# Patient Record
Sex: Female | Born: 1985
Health system: Southern US, Community
[De-identification: ages and names within clinical notes are randomized; demographics above are authoritative.]

## PROBLEM LIST (undated history)

## (undated) DIAGNOSIS — G510 Bell's palsy: Secondary | ICD-10-CM

## (undated) HISTORY — DX: Bell's palsy: G51.0

---

## 2021-08-29 DIAGNOSIS — R918 Other nonspecific abnormal finding of lung field: Secondary | ICD-10-CM | POA: Diagnosis not present

## 2021-08-29 DIAGNOSIS — R000 Tachycardia, unspecified: Secondary | ICD-10-CM | POA: Diagnosis not present

## 2021-08-29 DIAGNOSIS — S270XXA Traumatic pneumothorax, initial encounter: Secondary | ICD-10-CM | POA: Diagnosis not present

## 2021-08-29 DIAGNOSIS — S32058A Other fracture of fifth lumbar vertebra, initial encounter for closed fracture: Secondary | ICD-10-CM | POA: Diagnosis not present

## 2021-08-29 DIAGNOSIS — J90 Pleural effusion, not elsewhere classified: Secondary | ICD-10-CM | POA: Diagnosis not present

## 2021-08-29 DIAGNOSIS — S299XXA Unspecified injury of thorax, initial encounter: Secondary | ICD-10-CM | POA: Diagnosis not present

## 2021-08-29 DIAGNOSIS — S2091XA Abrasion of unspecified parts of thorax, initial encounter: Secondary | ICD-10-CM | POA: Diagnosis not present

## 2021-08-29 DIAGNOSIS — S30811A Abrasion of abdominal wall, initial encounter: Secondary | ICD-10-CM | POA: Diagnosis not present

## 2021-08-29 DIAGNOSIS — J939 Pneumothorax, unspecified: Secondary | ICD-10-CM | POA: Diagnosis not present

## 2021-08-29 DIAGNOSIS — S27321A Contusion of lung, unilateral, initial encounter: Secondary | ICD-10-CM | POA: Diagnosis not present

## 2021-08-29 DIAGNOSIS — S2242XA Multiple fractures of ribs, left side, initial encounter for closed fracture: Secondary | ICD-10-CM | POA: Diagnosis not present

## 2021-08-29 DIAGNOSIS — S36031A Moderate laceration of spleen, initial encounter: Secondary | ICD-10-CM | POA: Diagnosis not present

## 2021-08-29 DIAGNOSIS — Z043 Encounter for examination and observation following other accident: Secondary | ICD-10-CM | POA: Diagnosis not present

## 2021-08-30 DIAGNOSIS — Z4682 Encounter for fitting and adjustment of non-vascular catheter: Secondary | ICD-10-CM | POA: Diagnosis not present

## 2021-08-30 DIAGNOSIS — J939 Pneumothorax, unspecified: Secondary | ICD-10-CM | POA: Diagnosis not present

## 2021-08-30 DIAGNOSIS — Z452 Encounter for adjustment and management of vascular access device: Secondary | ICD-10-CM | POA: Diagnosis not present

## 2021-09-05 DIAGNOSIS — J9610 Chronic respiratory failure, unspecified whether with hypoxia or hypercapnia: Secondary | ICD-10-CM | POA: Diagnosis not present

## 2021-09-05 DIAGNOSIS — S199XXA Unspecified injury of neck, initial encounter: Secondary | ICD-10-CM | POA: Diagnosis not present

## 2021-09-06 DIAGNOSIS — R609 Edema, unspecified: Secondary | ICD-10-CM | POA: Diagnosis not present

## 2021-09-06 DIAGNOSIS — S36031A Moderate laceration of spleen, initial encounter: Secondary | ICD-10-CM | POA: Diagnosis not present

## 2021-09-06 DIAGNOSIS — S15092A Other specified injury of left carotid artery, initial encounter: Secondary | ICD-10-CM | POA: Diagnosis not present

## 2021-09-06 DIAGNOSIS — Z9911 Dependence on respirator [ventilator] status: Secondary | ICD-10-CM | POA: Diagnosis not present

## 2021-09-08 DIAGNOSIS — S15092A Other specified injury of left carotid artery, initial encounter: Secondary | ICD-10-CM | POA: Diagnosis not present

## 2021-09-08 DIAGNOSIS — S36031A Moderate laceration of spleen, initial encounter: Secondary | ICD-10-CM | POA: Diagnosis not present

## 2021-09-08 DIAGNOSIS — Z930 Tracheostomy status: Secondary | ICD-10-CM | POA: Diagnosis not present

## 2021-09-09 DIAGNOSIS — S36031A Moderate laceration of spleen, initial encounter: Secondary | ICD-10-CM | POA: Diagnosis not present

## 2021-09-09 DIAGNOSIS — Z930 Tracheostomy status: Secondary | ICD-10-CM | POA: Diagnosis not present

## 2021-09-09 DIAGNOSIS — S15092A Other specified injury of left carotid artery, initial encounter: Secondary | ICD-10-CM | POA: Diagnosis not present

## 2021-09-16 DIAGNOSIS — S14105D Unspecified injury at C5 level of cervical spinal cord, subsequent encounter: Secondary | ICD-10-CM | POA: Diagnosis not present

## 2021-09-16 DIAGNOSIS — I639 Cerebral infarction, unspecified: Secondary | ICD-10-CM | POA: Diagnosis not present

## 2021-09-16 DIAGNOSIS — G950 Syringomyelia and syringobulbia: Secondary | ICD-10-CM | POA: Diagnosis not present

## 2021-09-16 DIAGNOSIS — S14106D Unspecified injury at C6 level of cervical spinal cord, subsequent encounter: Secondary | ICD-10-CM | POA: Diagnosis not present

## 2021-09-16 DIAGNOSIS — S02113D Unspecified occipital condyle fracture, subsequent encounter for fracture with routine healing: Secondary | ICD-10-CM | POA: Diagnosis not present

## 2021-09-19 DIAGNOSIS — I7774 Dissection of vertebral artery: Secondary | ICD-10-CM | POA: Diagnosis not present

## 2021-09-19 DIAGNOSIS — R930 Abnormal findings on diagnostic imaging of skull and head, not elsewhere classified: Secondary | ICD-10-CM | POA: Diagnosis not present

## 2021-09-22 DIAGNOSIS — E039 Hypothyroidism, unspecified: Secondary | ICD-10-CM | POA: Diagnosis not present

## 2021-09-22 DIAGNOSIS — S069X0A Unspecified intracranial injury without loss of consciousness, initial encounter: Secondary | ICD-10-CM | POA: Diagnosis not present

## 2021-09-22 DIAGNOSIS — R000 Tachycardia, unspecified: Secondary | ICD-10-CM | POA: Diagnosis not present

## 2021-09-22 DIAGNOSIS — R739 Hyperglycemia, unspecified: Secondary | ICD-10-CM | POA: Diagnosis not present

## 2021-09-24 DIAGNOSIS — R918 Other nonspecific abnormal finding of lung field: Secondary | ICD-10-CM | POA: Diagnosis not present

## 2021-09-24 DIAGNOSIS — J90 Pleural effusion, not elsewhere classified: Secondary | ICD-10-CM | POA: Diagnosis not present

## 2021-09-24 DIAGNOSIS — R509 Fever, unspecified: Secondary | ICD-10-CM | POA: Diagnosis not present

## 2021-09-26 DIAGNOSIS — B9620 Unspecified Escherichia coli [E. coli] as the cause of diseases classified elsewhere: Secondary | ICD-10-CM | POA: Diagnosis not present

## 2021-09-26 DIAGNOSIS — R7881 Bacteremia: Secondary | ICD-10-CM | POA: Diagnosis not present

## 2021-09-27 DIAGNOSIS — R7881 Bacteremia: Secondary | ICD-10-CM | POA: Diagnosis not present

## 2021-09-27 DIAGNOSIS — B9620 Unspecified Escherichia coli [E. coli] as the cause of diseases classified elsewhere: Secondary | ICD-10-CM | POA: Diagnosis not present

## 2021-09-28 DIAGNOSIS — I959 Hypotension, unspecified: Secondary | ICD-10-CM | POA: Diagnosis not present

## 2021-09-28 DIAGNOSIS — E039 Hypothyroidism, unspecified: Secondary | ICD-10-CM | POA: Diagnosis not present

## 2021-09-28 DIAGNOSIS — S069X0A Unspecified intracranial injury without loss of consciousness, initial encounter: Secondary | ICD-10-CM | POA: Diagnosis not present

## 2021-09-28 DIAGNOSIS — R739 Hyperglycemia, unspecified: Secondary | ICD-10-CM | POA: Diagnosis not present

## 2021-09-30 DIAGNOSIS — E039 Hypothyroidism, unspecified: Secondary | ICD-10-CM | POA: Diagnosis not present

## 2021-09-30 DIAGNOSIS — S069X0A Unspecified intracranial injury without loss of consciousness, initial encounter: Secondary | ICD-10-CM | POA: Diagnosis not present

## 2021-09-30 DIAGNOSIS — H532 Diplopia: Secondary | ICD-10-CM | POA: Diagnosis not present

## 2021-09-30 DIAGNOSIS — R000 Tachycardia, unspecified: Secondary | ICD-10-CM | POA: Diagnosis not present

## 2021-10-01 DIAGNOSIS — I959 Hypotension, unspecified: Secondary | ICD-10-CM | POA: Diagnosis not present

## 2021-10-01 DIAGNOSIS — H532 Diplopia: Secondary | ICD-10-CM | POA: Diagnosis not present

## 2021-10-01 DIAGNOSIS — E039 Hypothyroidism, unspecified: Secondary | ICD-10-CM | POA: Diagnosis not present

## 2021-10-01 DIAGNOSIS — S069X0A Unspecified intracranial injury without loss of consciousness, initial encounter: Secondary | ICD-10-CM | POA: Diagnosis not present

## 2021-10-02 DIAGNOSIS — E039 Hypothyroidism, unspecified: Secondary | ICD-10-CM | POA: Diagnosis not present

## 2021-10-02 DIAGNOSIS — S069X0A Unspecified intracranial injury without loss of consciousness, initial encounter: Secondary | ICD-10-CM | POA: Diagnosis not present

## 2021-10-02 DIAGNOSIS — I959 Hypotension, unspecified: Secondary | ICD-10-CM | POA: Diagnosis not present

## 2021-10-02 DIAGNOSIS — R000 Tachycardia, unspecified: Secondary | ICD-10-CM | POA: Diagnosis not present

## 2021-10-03 DIAGNOSIS — R000 Tachycardia, unspecified: Secondary | ICD-10-CM | POA: Diagnosis not present

## 2021-10-03 DIAGNOSIS — S069X0A Unspecified intracranial injury without loss of consciousness, initial encounter: Secondary | ICD-10-CM | POA: Diagnosis not present

## 2021-10-03 DIAGNOSIS — I959 Hypotension, unspecified: Secondary | ICD-10-CM | POA: Diagnosis not present

## 2021-10-03 DIAGNOSIS — E039 Hypothyroidism, unspecified: Secondary | ICD-10-CM | POA: Diagnosis not present

## 2021-10-20 ENCOUNTER — Encounter: Payer: Self-pay | Admitting: Occupational Therapy

## 2021-10-20 ENCOUNTER — Ambulatory Visit: Payer: 59 | Attending: Family Medicine | Admitting: Speech Pathology

## 2021-10-20 ENCOUNTER — Ambulatory Visit: Payer: 59 | Admitting: Occupational Therapy

## 2021-10-20 DIAGNOSIS — R41841 Cognitive communication deficit: Secondary | ICD-10-CM | POA: Diagnosis not present

## 2021-10-20 DIAGNOSIS — M6281 Muscle weakness (generalized): Secondary | ICD-10-CM
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
## 2021-10-26 DIAGNOSIS — H5022 Vertical strabismus, left eye: Secondary | ICD-10-CM | POA: Diagnosis not present

## 2021-10-27 ENCOUNTER — Encounter: Payer: Self-pay | Admitting: Speech Pathology

## 2021-10-27 ENCOUNTER — Ambulatory Visit: Payer: 59 | Attending: Family Medicine | Admitting: Occupational Therapy

## 2021-10-27 DIAGNOSIS — R2681 Unsteadiness on feet: Secondary | ICD-10-CM | POA: Diagnosis not present

## 2021-10-27 DIAGNOSIS — R278 Other lack of coordination: Secondary | ICD-10-CM | POA: Diagnosis not present

## 2021-10-27 DIAGNOSIS — R41841 Cognitive communication deficit: Secondary | ICD-10-CM | POA: Insufficient documentation

## 2021-10-27 DIAGNOSIS — M6281 Muscle weakness (generalized): Secondary | ICD-10-CM | POA: Diagnosis not present
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
## 2021-10-31 ENCOUNTER — Encounter: Payer: Self-pay | Admitting: Speech Pathology

## 2021-10-31 ENCOUNTER — Ambulatory Visit: Payer: 59 | Admitting: Occupational Therapy

## 2021-10-31 DIAGNOSIS — R2681 Unsteadiness on feet: Secondary | ICD-10-CM | POA: Diagnosis not present

## 2021-10-31 DIAGNOSIS — M6281 Muscle weakness (generalized): Secondary | ICD-10-CM

## 2021-10-31 DIAGNOSIS — R41841 Cognitive communication deficit: Secondary | ICD-10-CM | POA: Diagnosis not present

## 2021-10-31 DIAGNOSIS — R278 Other lack of coordination: Secondary | ICD-10-CM | POA: Diagnosis not present
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
## 2021-11-03 ENCOUNTER — Encounter: Payer: Self-pay | Admitting: Speech Pathology

## 2021-11-03 ENCOUNTER — Ambulatory Visit: Payer: 59 | Admitting: Occupational Therapy

## 2021-11-03 DIAGNOSIS — M6281 Muscle weakness (generalized): Secondary | ICD-10-CM

## 2021-11-03 DIAGNOSIS — R2681 Unsteadiness on feet: Secondary | ICD-10-CM | POA: Diagnosis not present

## 2021-11-03 DIAGNOSIS — S0211BD Type I occipital condyle fracture, left side, subsequent encounter for fracture with routine healing: Secondary | ICD-10-CM | POA: Diagnosis not present

## 2021-11-03 DIAGNOSIS — S0211BA Type I occipital condyle fracture, left side, initial encounter for closed fracture: Secondary | ICD-10-CM | POA: Diagnosis not present

## 2021-11-03 DIAGNOSIS — R41841 Cognitive communication deficit: Secondary | ICD-10-CM | POA: Diagnosis not present

## 2021-11-03 DIAGNOSIS — H4912 Fourth [trochlear] nerve palsy, left eye: Secondary | ICD-10-CM | POA: Diagnosis not present

## 2021-11-03 DIAGNOSIS — R278 Other lack of coordination: Secondary | ICD-10-CM

## 2021-11-03 DIAGNOSIS — I7771 Dissection of carotid artery: Secondary | ICD-10-CM | POA: Diagnosis not present

## 2021-11-03 DIAGNOSIS — I7774 Dissection of vertebral artery: Secondary | ICD-10-CM | POA: Diagnosis not present

## 2021-11-03 DIAGNOSIS — M4602 Spinal enthesopathy, cervical region: Secondary | ICD-10-CM | POA: Diagnosis not present
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
## 2021-11-07 ENCOUNTER — Other Ambulatory Visit: Payer: Self-pay | Admitting: Neurosurgery

## 2021-11-07 DIAGNOSIS — S0211BA Type I occipital condyle fracture, left side, initial encounter for closed fracture: Secondary | ICD-10-CM

## 2021-11-08 ENCOUNTER — Ambulatory Visit: Payer: 59 | Admitting: Occupational Therapy

## 2021-11-08 ENCOUNTER — Encounter: Payer: Self-pay | Admitting: Speech Pathology

## 2021-11-08 ENCOUNTER — Encounter: Payer: Self-pay | Admitting: Physical Therapy

## 2021-11-08 ENCOUNTER — Ambulatory Visit: Payer: 59 | Admitting: Physical Therapy

## 2021-11-08 DIAGNOSIS — M6281 Muscle weakness (generalized): Secondary | ICD-10-CM

## 2021-11-08 DIAGNOSIS — R278 Other lack of coordination: Secondary | ICD-10-CM | POA: Diagnosis not present

## 2021-11-08 DIAGNOSIS — R2681 Unsteadiness on feet: Secondary | ICD-10-CM

## 2021-11-08 DIAGNOSIS — R41841 Cognitive communication deficit: Secondary | ICD-10-CM | POA: Diagnosis not present
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
## 2021-11-08 NOTE — Addendum Note (Signed)
Addended by: Thresa Ross B on: 11/08/2021 02:26 PM ? ? Modules accepted: Orders ? ?

## 2021-11-10 ENCOUNTER — Ambulatory Visit: Payer: 59

## 2021-11-10 ENCOUNTER — Encounter: Payer: 59 | Admitting: Occupational Therapy

## 2021-11-10 ENCOUNTER — Encounter: Payer: 59 | Admitting: Speech Pathology

## 2021-11-10 DIAGNOSIS — M6281 Muscle weakness (generalized): Secondary | ICD-10-CM

## 2021-11-10 DIAGNOSIS — R2681 Unsteadiness on feet: Secondary | ICD-10-CM

## 2021-11-10 DIAGNOSIS — R278 Other lack of coordination: Secondary | ICD-10-CM | POA: Diagnosis not present

## 2021-11-10 DIAGNOSIS — R41841 Cognitive communication deficit: Secondary | ICD-10-CM | POA: Diagnosis not present
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
## 2021-11-14 ENCOUNTER — Ambulatory Visit: Payer: 59

## 2021-11-14 ENCOUNTER — Encounter: Payer: Self-pay | Admitting: Physical Therapy

## 2021-11-14 ENCOUNTER — Ambulatory Visit: Payer: 59 | Admitting: Physical Therapy

## 2021-11-14 DIAGNOSIS — R41841 Cognitive communication deficit: Secondary | ICD-10-CM | POA: Diagnosis not present

## 2021-11-14 DIAGNOSIS — R278 Other lack of coordination: Secondary | ICD-10-CM | POA: Diagnosis not present

## 2021-11-14 DIAGNOSIS — R2681 Unsteadiness on feet: Secondary | ICD-10-CM

## 2021-11-14 DIAGNOSIS — M6281 Muscle weakness (generalized): Secondary | ICD-10-CM | POA: Diagnosis not present

## 2021-11-14 NOTE — Therapy (Signed)
[place] [facility] MAIN [facility] SERVICES [address] [place], [place], [address] Phone: [phone]   Fax:  [phone]  Physical Therapy Treatment  Patient Details  Name: Kimberly Chan MRN: [id number] Date of Birth: 1985/07/28 No data recorded  Encounter Date: 11/14/2021   PT End of Session - 11/14/21 1257     Visit Number 3    Number of Visits 12    Date for PT Re-Evaluation 01/31/22    Progress Note Due on Visit 10    PT Start Time 1302    PT Stop Time 1345    PT Time Calculation (min) 43 min    Equipment Utilized During Treatment Gait belt;Cervical collar   Pt wears cervical collar upon entering clinic   Activity Tolerance Patient tolerated treatment well    Behavior During Therapy [facility] for tasks assessed/performed             History reviewed. No pertinent past medical history.  History reviewed. No pertinent surgical history.  There were no vitals filed for this visit.                 INTERVENTIONS:    Neuromuscular re-ed: performed in // bars with CGA and use of gait belt   Standing on 1/2 bolster (round side up): -BLE heel raises x15 reps -BLE toe raises x15 reps;  Standing on airex pad: - feet together, eyes open 30 sec, no sway, eyes closed 15 sec, mild sway but able to keep balance; -standing one foot on airex, one foot on 8 inch step:  Unsupported standing 30 sec hold  Progressed to trunk rotation side/side x3 reps, mild difficulty -alternate toe taps airex to 6 inch step x15 reps unsupported;  Stepping over orange hurdle with single leg: Forward/backward x5 reps each LE Side/side x5 reps each LE Both unsupported with good foot clearance and safety;  Forward lunge, static hold while holding small ball, arms side/side x1 rep each direction, x5 reps each LE in front; reports increased stiffness/discomfort in LLE while RLE ahead of LLE; also reports mild fatigue;    Advanced to tandem stand on airex  pad- Mostly using ankle righting reaction with EO yet became unsteady with EO   Progressed to Tandem gait in // bars - Forward/backward- mild unsteadiness x 5 - improved each trial   SLS on firm EO/EC- 5-10 sec  SLS on foam- EO x 5-100 sec each   A/P rocker board- initially standing still then ant/post wt shift   Grapevine gait in // bars- x 5 each direction.    Education provided throughout session via VC/TC and demonstration to facilitate movement at target joints and correct muscle activation for all testing and exercises performed.                             PT Education - 11/14/21 1255     Education Details exercise technique/positioning;    Person(s) Educated Patient    Methods Explanation;Verbal cues    Comprehension Verbalized understanding;Returned demonstration;Verbal cues required;Need further instruction              PT Short Term Goals - 11/08/21 1415       PT SHORT TERM GOAL #1   Title Patient will be independent in progressive home exercise program to improve strength/mobility for better functional independence with ADLs.    Baseline independent with HEp from hospital, has not been progressed  Time 3    Period Weeks    Status New    Target Date 11/29/21               PT Long Term Goals - 11/08/21 1416       PT LONG TERM GOAL #1   Title Patient will increase FOTO score to equal to or greater than  73   to demonstrate statistically significant improvement in mobility and quality of life.    Baseline 60    Time 12    Period Weeks    Status New    Target Date 01/31/22      PT LONG TERM GOAL #2   Title Patient will increase Berg Balance score by > 6 points to demonstrate decreased fall risk during functional activities.    Baseline 48    Time 12    Period Weeks    Status New    Target Date 01/31/22      PT LONG TERM GOAL #3   Title Patient will increase 10 meter walk test to >1.37m/s as to improve gait speed for better  community ambulation and to reduce fall risk.    Baseline assess visit 2    Time 12    Period Weeks    Status New    Target Date 01/31/22      PT LONG TERM GOAL #4   Title Patient will ascend/descend 4 stairs without rail assist independently without loss of balance to improve ability to get in/out of home.    Baseline perofrmes with rail and with step to gait patern descending    Time 12    Period Weeks    Status New    Target Date 01/31/22      PT LONG TERM GOAL #5   Title Patient will increase ABC scale score >80% to demonstrate better functional mobility and better confidence with ADLs.    Baseline 48.125%    Time 12    Period Weeks    Status New    Target Date 01/31/22                    Patient will benefit from skilled therapeutic intervention in order to improve the following deficits and impairments:     Visit Diagnosis: Muscle weakness (generalized)  Other lack of coordination  Unsteadiness on feet     Problem List There are no problems to display for this patient.   Jahad Old, PT 11/14/2021, 12:59 PM  [place] [facility] MAIN [facility] SERVICES [address] [place], [place], [address] Phone: [phone]   Fax:  [phone]  Name: RILDA BULLS MRN: [id number] Date of Birth: [birth year]
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
## 2021-11-16 ENCOUNTER — Ambulatory Visit: Payer: 59

## 2021-11-16 DIAGNOSIS — R2681 Unsteadiness on feet: Secondary | ICD-10-CM | POA: Diagnosis not present

## 2021-11-16 DIAGNOSIS — M6281 Muscle weakness (generalized): Secondary | ICD-10-CM

## 2021-11-16 DIAGNOSIS — R41841 Cognitive communication deficit: Secondary | ICD-10-CM | POA: Diagnosis not present

## 2021-11-16 DIAGNOSIS — R278 Other lack of coordination: Secondary | ICD-10-CM
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
## 2021-11-17 ENCOUNTER — Encounter: Payer: 59 | Admitting: Speech Pathology

## 2021-11-17 ENCOUNTER — Encounter: Payer: 59 | Admitting: Occupational Therapy
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
## 2021-11-22 ENCOUNTER — Ambulatory Visit: Payer: 59 | Admitting: Occupational Therapy

## 2021-11-24 ENCOUNTER — Encounter: Payer: 59 | Admitting: Speech Pathology

## 2021-11-24 ENCOUNTER — Ambulatory Visit: Payer: 59 | Attending: Family Medicine

## 2021-11-24 DIAGNOSIS — M6281 Muscle weakness (generalized): Secondary | ICD-10-CM | POA: Insufficient documentation

## 2021-11-24 DIAGNOSIS — R278 Other lack of coordination: Secondary | ICD-10-CM | POA: Diagnosis not present

## 2021-11-24 DIAGNOSIS — R2681 Unsteadiness on feet: Secondary | ICD-10-CM | POA: Diagnosis not present
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
## 2021-11-25 DIAGNOSIS — H5022 Vertical strabismus, left eye: Secondary | ICD-10-CM | POA: Diagnosis not present

## 2021-11-28 ENCOUNTER — Ambulatory Visit: Payer: 59

## 2021-11-30 ENCOUNTER — Ambulatory Visit: Payer: 59

## 2021-11-30 ENCOUNTER — Ambulatory Visit: Payer: Self-pay | Admitting: Physical Therapy

## 2021-11-30 DIAGNOSIS — M6281 Muscle weakness (generalized): Secondary | ICD-10-CM

## 2021-11-30 DIAGNOSIS — R278 Other lack of coordination: Secondary | ICD-10-CM | POA: Diagnosis not present

## 2021-11-30 DIAGNOSIS — R2681 Unsteadiness on feet: Secondary | ICD-10-CM
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
## 2021-12-01 ENCOUNTER — Encounter: Payer: 59 | Admitting: Occupational Therapy

## 2021-12-01 ENCOUNTER — Ambulatory Visit: Payer: 59 | Admitting: Speech Pathology

## 2021-12-06 ENCOUNTER — Ambulatory Visit
Admission: RE | Admit: 2021-12-06 | Discharge: 2021-12-06 | Disposition: A | Discharge status: Home / Self Care | Payer: 59 | Source: Ambulatory Visit | Attending: Neurosurgery | Admitting: Neurosurgery

## 2021-12-06 ENCOUNTER — Encounter: Payer: 59 | Admitting: Speech Pathology

## 2021-12-06 ENCOUNTER — Ambulatory Visit: Payer: 59 | Admitting: Physical Therapy

## 2021-12-06 DIAGNOSIS — G9389 Other specified disorders of brain: Secondary | ICD-10-CM | POA: Diagnosis not present

## 2021-12-06 DIAGNOSIS — Z8679 Personal history of other diseases of the circulatory system: Secondary | ICD-10-CM | POA: Diagnosis not present

## 2021-12-06 DIAGNOSIS — S0211BA Type I occipital condyle fracture, left side, initial encounter for closed fracture: Secondary | ICD-10-CM

## 2021-12-06 DIAGNOSIS — S0211HA Other fracture of occiput, left side, initial encounter for closed fracture: Secondary | ICD-10-CM | POA: Diagnosis not present

## 2021-12-06 MED ORDER — IOPAMIDOL (ISOVUE-370) INJECTION 76%
75.0000 mL | Freq: Once | INTRAVENOUS | Status: AC | PRN
  Administered 2021-12-06: 75 mL via INTRAVENOUS

## 2021-12-07 ENCOUNTER — Ambulatory Visit: Payer: 59

## 2021-12-07 ENCOUNTER — Encounter: Payer: 59 | Admitting: Occupational Therapy

## 2021-12-13 ENCOUNTER — Encounter: Payer: 59 | Admitting: Occupational Therapy

## 2021-12-13 ENCOUNTER — Ambulatory Visit: Payer: 59 | Admitting: Physical Therapy

## 2021-12-14 ENCOUNTER — Ambulatory Visit: Payer: 59

## 2021-12-15 ENCOUNTER — Ambulatory Visit: Payer: 59

## 2021-12-15 ENCOUNTER — Encounter: Payer: 59 | Admitting: Occupational Therapy

## 2021-12-15 DIAGNOSIS — Z8679 Personal history of other diseases of the circulatory system: Secondary | ICD-10-CM | POA: Diagnosis not present

## 2021-12-15 DIAGNOSIS — M47812 Spondylosis without myelopathy or radiculopathy, cervical region: Secondary | ICD-10-CM | POA: Diagnosis not present

## 2021-12-15 DIAGNOSIS — I7774 Dissection of vertebral artery: Secondary | ICD-10-CM | POA: Diagnosis not present

## 2021-12-15 DIAGNOSIS — S0211BD Type I occipital condyle fracture, left side, subsequent encounter for fracture with routine healing: Secondary | ICD-10-CM | POA: Diagnosis not present

## 2021-12-20 ENCOUNTER — Encounter: Payer: 59 | Admitting: Occupational Therapy

## 2021-12-20 ENCOUNTER — Ambulatory Visit: Payer: 59

## 2021-12-22 ENCOUNTER — Encounter: Payer: 59 | Admitting: Occupational Therapy

## 2021-12-22 ENCOUNTER — Ambulatory Visit: Payer: 59

## 2021-12-26 DIAGNOSIS — S12431D Unspecified traumatic nondisplaced spondylolisthesis of fifth cervical vertebra, subsequent encounter for fracture with routine healing: Secondary | ICD-10-CM | POA: Diagnosis not present

## 2021-12-26 DIAGNOSIS — Z6826 Body mass index (BMI) 26.0-26.9, adult: Secondary | ICD-10-CM | POA: Diagnosis not present

## 2021-12-26 DIAGNOSIS — S12431S Unspecified traumatic nondisplaced spondylolisthesis of fifth cervical vertebra, sequela: Secondary | ICD-10-CM | POA: Diagnosis not present

## 2021-12-26 DIAGNOSIS — R221 Localized swelling, mass and lump, neck: Secondary | ICD-10-CM | POA: Diagnosis not present

## 2021-12-26 DIAGNOSIS — Z1331 Encounter for screening for depression: Secondary | ICD-10-CM | POA: Diagnosis not present

## 2021-12-26 DIAGNOSIS — N926 Irregular menstruation, unspecified: Secondary | ICD-10-CM | POA: Diagnosis not present

## 2021-12-26 DIAGNOSIS — M436 Torticollis: Secondary | ICD-10-CM | POA: Diagnosis not present

## 2021-12-26 DIAGNOSIS — Z0000 Encounter for general adult medical examination without abnormal findings: Secondary | ICD-10-CM | POA: Diagnosis not present

## 2021-12-26 DIAGNOSIS — Z7689 Persons encountering health services in other specified circumstances: Secondary | ICD-10-CM | POA: Diagnosis not present

## 2021-12-26 DIAGNOSIS — H532 Diplopia: Secondary | ICD-10-CM | POA: Diagnosis not present

## 2021-12-26 DIAGNOSIS — Z87828 Personal history of other (healed) physical injury and trauma: Secondary | ICD-10-CM | POA: Diagnosis not present

## 2021-12-26 DIAGNOSIS — E039 Hypothyroidism, unspecified: Secondary | ICD-10-CM | POA: Diagnosis not present

## 2021-12-26 DIAGNOSIS — Z9889 Other specified postprocedural states: Secondary | ICD-10-CM | POA: Diagnosis not present

## 2021-12-29 ENCOUNTER — Other Ambulatory Visit: Payer: Self-pay | Admitting: Internal Medicine

## 2021-12-29 DIAGNOSIS — R221 Localized swelling, mass and lump, neck: Secondary | ICD-10-CM

## 2021-12-29 DIAGNOSIS — Z87828 Personal history of other (healed) physical injury and trauma: Secondary | ICD-10-CM

## 2021-12-29 DIAGNOSIS — M436 Torticollis: Secondary | ICD-10-CM

## 2021-12-29 DIAGNOSIS — E039 Hypothyroidism, unspecified: Secondary | ICD-10-CM

## 2022-01-05 ENCOUNTER — Ambulatory Visit: Admission: RE | Admit: 2022-01-05 | Payer: 59 | Source: Ambulatory Visit

## 2022-01-12 DIAGNOSIS — M542 Cervicalgia: Secondary | ICD-10-CM | POA: Diagnosis not present

## 2022-01-16 DIAGNOSIS — M542 Cervicalgia: Secondary | ICD-10-CM | POA: Diagnosis not present

## 2022-01-25 ENCOUNTER — Encounter: Payer: 59 | Admitting: Occupational Therapy

## 2022-01-25 ENCOUNTER — Ambulatory Visit: Payer: 59

## 2022-01-26 DIAGNOSIS — M542 Cervicalgia: Secondary | ICD-10-CM | POA: Diagnosis not present

## 2022-01-31 ENCOUNTER — Encounter: Payer: 59 | Admitting: Occupational Therapy

## 2022-01-31 ENCOUNTER — Ambulatory Visit: Payer: 59

## 2022-02-02 DIAGNOSIS — R37 Sexual dysfunction, unspecified: Secondary | ICD-10-CM | POA: Diagnosis not present

## 2022-02-02 DIAGNOSIS — R102 Pelvic and perineal pain: Secondary | ICD-10-CM | POA: Diagnosis not present

## 2022-02-02 DIAGNOSIS — N925 Other specified irregular menstruation: Secondary | ICD-10-CM | POA: Diagnosis not present

## 2022-02-02 DIAGNOSIS — M542 Cervicalgia: Secondary | ICD-10-CM | POA: Diagnosis not present

## 2022-02-02 DIAGNOSIS — N911 Secondary amenorrhea: Secondary | ICD-10-CM | POA: Diagnosis not present

## 2022-02-02 DIAGNOSIS — H4912 Fourth [trochlear] nerve palsy, left eye: Secondary | ICD-10-CM | POA: Diagnosis not present

## 2022-02-02 DIAGNOSIS — E538 Deficiency of other specified B group vitamins: Secondary | ICD-10-CM | POA: Diagnosis not present

## 2022-02-02 DIAGNOSIS — R5382 Chronic fatigue, unspecified: Secondary | ICD-10-CM | POA: Diagnosis not present

## 2022-02-02 DIAGNOSIS — S0211BD Type I occipital condyle fracture, left side, subsequent encounter for fracture with routine healing: Secondary | ICD-10-CM | POA: Diagnosis not present

## 2022-02-02 DIAGNOSIS — F419 Anxiety disorder, unspecified: Secondary | ICD-10-CM | POA: Diagnosis not present

## 2022-02-02 DIAGNOSIS — N941 Unspecified dyspareunia: Secondary | ICD-10-CM | POA: Diagnosis not present

## 2022-02-02 DIAGNOSIS — I7771 Dissection of carotid artery: Secondary | ICD-10-CM | POA: Diagnosis not present

## 2022-02-02 DIAGNOSIS — E559 Vitamin D deficiency, unspecified: Secondary | ICD-10-CM | POA: Diagnosis not present

## 2022-02-06 DIAGNOSIS — M542 Cervicalgia: Secondary | ICD-10-CM | POA: Diagnosis not present

## 2022-02-08 ENCOUNTER — Ambulatory Visit: Payer: 59

## 2022-02-08 DIAGNOSIS — M542 Cervicalgia: Secondary | ICD-10-CM | POA: Diagnosis not present

## 2022-02-13 DIAGNOSIS — M542 Cervicalgia: Secondary | ICD-10-CM | POA: Diagnosis not present

## 2022-02-23 ENCOUNTER — Ambulatory Visit: Payer: 59

## 2022-04-19 ENCOUNTER — Other Ambulatory Visit: Payer: Self-pay | Admitting: Neurology

## 2022-04-19 DIAGNOSIS — M5481 Occipital neuralgia: Secondary | ICD-10-CM
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
## 2022-04-27 ENCOUNTER — Ambulatory Visit: Payer: 59 | Attending: Neurology

## 2022-04-27 DIAGNOSIS — R278 Other lack of coordination: Secondary | ICD-10-CM | POA: Diagnosis present

## 2022-04-27 DIAGNOSIS — R29810 Facial weakness: Secondary | ICD-10-CM | POA: Diagnosis present

## 2022-04-27 DIAGNOSIS — M542 Cervicalgia: Secondary | ICD-10-CM | POA: Diagnosis present

## 2022-04-27 DIAGNOSIS — M6281 Muscle weakness (generalized): Secondary | ICD-10-CM | POA: Diagnosis present

## 2022-04-27 DIAGNOSIS — R2681 Unsteadiness on feet: Secondary | ICD-10-CM | POA: Diagnosis present
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
## 2022-05-02 ENCOUNTER — Telehealth: Payer: Self-pay

## 2022-05-02 ENCOUNTER — Ambulatory Visit: Payer: 59

## 2022-05-02 NOTE — Telephone Encounter (Signed)
Patient called as she missed her PT appointment this morning. Patient informed of her no show, reports she was confused on time of appointment. Confirmed time for next appointment with patient.    Janna Arch, PT, DPT
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
## 2022-05-04 ENCOUNTER — Ambulatory Visit: Payer: 59

## 2022-05-04 DIAGNOSIS — R29810 Facial weakness: Secondary | ICD-10-CM | POA: Diagnosis not present

## 2022-05-04 DIAGNOSIS — M542 Cervicalgia: Secondary | ICD-10-CM

## 2022-05-04 DIAGNOSIS — M6281 Muscle weakness (generalized): Secondary | ICD-10-CM

## 2022-05-09 ENCOUNTER — Ambulatory Visit: Payer: 59

## 2022-05-09 DIAGNOSIS — M6281 Muscle weakness (generalized): Secondary | ICD-10-CM

## 2022-05-09 DIAGNOSIS — M542 Cervicalgia: Secondary | ICD-10-CM

## 2022-05-09 DIAGNOSIS — R29810 Facial weakness: Secondary | ICD-10-CM | POA: Diagnosis not present
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
## 2022-05-11 ENCOUNTER — Ambulatory Visit: Payer: 59

## 2022-05-11 DIAGNOSIS — R29810 Facial weakness: Secondary | ICD-10-CM

## 2022-05-11 DIAGNOSIS — M6281 Muscle weakness (generalized): Secondary | ICD-10-CM

## 2022-05-11 DIAGNOSIS — R2681 Unsteadiness on feet: Secondary | ICD-10-CM

## 2022-05-11 DIAGNOSIS — M542 Cervicalgia: Secondary | ICD-10-CM
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
## 2022-05-16 ENCOUNTER — Ambulatory Visit: Payer: 59

## 2022-05-16 ENCOUNTER — Ambulatory Visit: Payer: 59 | Admitting: Occupational Therapy

## 2022-05-16 DIAGNOSIS — M6281 Muscle weakness (generalized): Secondary | ICD-10-CM

## 2022-05-16 DIAGNOSIS — R29810 Facial weakness: Secondary | ICD-10-CM | POA: Diagnosis not present
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
## 2022-05-23 ENCOUNTER — Ambulatory Visit: Payer: 59

## 2022-05-23 ENCOUNTER — Ambulatory Visit: Payer: 59 | Admitting: Occupational Therapy

## 2022-05-24 ENCOUNTER — Ambulatory Visit: Payer: 59 | Admitting: Occupational Therapy

## 2022-05-24 DIAGNOSIS — M6281 Muscle weakness (generalized): Secondary | ICD-10-CM

## 2022-05-24 DIAGNOSIS — R29810 Facial weakness: Secondary | ICD-10-CM | POA: Diagnosis not present

## 2022-05-24 DIAGNOSIS — R278 Other lack of coordination: Secondary | ICD-10-CM
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
## 2022-05-25 ENCOUNTER — Ambulatory Visit: Payer: 59 | Admitting: Occupational Therapy

## 2022-05-25 ENCOUNTER — Ambulatory Visit: Payer: 59

## 2022-05-30 ENCOUNTER — Encounter: Payer: 59 | Admitting: Occupational Therapy

## 2022-05-31 ENCOUNTER — Ambulatory Visit: Payer: 59 | Attending: Neurology | Admitting: Occupational Therapy

## 2022-05-31 DIAGNOSIS — M533 Sacrococcygeal disorders, not elsewhere classified: Secondary | ICD-10-CM | POA: Insufficient documentation

## 2022-05-31 DIAGNOSIS — R2689 Other abnormalities of gait and mobility: Secondary | ICD-10-CM | POA: Insufficient documentation

## 2022-05-31 DIAGNOSIS — M62838 Other muscle spasm: Secondary | ICD-10-CM | POA: Insufficient documentation

## 2022-05-31 DIAGNOSIS — M6281 Muscle weakness (generalized): Secondary | ICD-10-CM | POA: Insufficient documentation

## 2022-05-31 DIAGNOSIS — R278 Other lack of coordination: Secondary | ICD-10-CM | POA: Insufficient documentation

## 2022-06-01 ENCOUNTER — Encounter: Payer: 59 | Admitting: Occupational Therapy

## 2022-06-01 ENCOUNTER — Ambulatory Visit: Payer: 59

## 2022-06-06 ENCOUNTER — Ambulatory Visit: Payer: 59

## 2022-06-07 ENCOUNTER — Ambulatory Visit: Payer: 59

## 2022-06-07 DIAGNOSIS — R278 Other lack of coordination: Secondary | ICD-10-CM

## 2022-06-07 DIAGNOSIS — M6281 Muscle weakness (generalized): Secondary | ICD-10-CM | POA: Diagnosis present

## 2022-06-07 DIAGNOSIS — R2689 Other abnormalities of gait and mobility: Secondary | ICD-10-CM | POA: Diagnosis present

## 2022-06-07 DIAGNOSIS — M533 Sacrococcygeal disorders, not elsewhere classified: Secondary | ICD-10-CM | POA: Diagnosis present

## 2022-06-07 DIAGNOSIS — M62838 Other muscle spasm: Secondary | ICD-10-CM | POA: Diagnosis present
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
## 2022-06-08 ENCOUNTER — Ambulatory Visit: Payer: 59

## 2022-06-13 ENCOUNTER — Ambulatory Visit: Payer: 59

## 2022-06-14 ENCOUNTER — Telehealth: Payer: Self-pay | Admitting: Occupational Therapy

## 2022-06-14 ENCOUNTER — Ambulatory Visit: Payer: 59 | Admitting: Occupational Therapy

## 2022-06-14 NOTE — Telephone Encounter (Signed)
Pt. Did not arrive for her appointment this morning. A follow-up phone call was attempted. However, received no response through the line.

## 2022-06-15 ENCOUNTER — Ambulatory Visit: Payer: 59

## 2022-06-15 ENCOUNTER — Encounter: Payer: Self-pay | Admitting: Physical Therapy

## 2022-06-15 ENCOUNTER — Ambulatory Visit: Payer: 59 | Admitting: Physical Therapy

## 2022-06-15 ENCOUNTER — Encounter: Payer: 59 | Admitting: Occupational Therapy

## 2022-06-15 DIAGNOSIS — M533 Sacrococcygeal disorders, not elsewhere classified: Secondary | ICD-10-CM | POA: Diagnosis not present

## 2022-06-15 DIAGNOSIS — M62838 Other muscle spasm: Secondary | ICD-10-CM

## 2022-06-15 DIAGNOSIS — R2689 Other abnormalities of gait and mobility: Secondary | ICD-10-CM

## 2022-06-15 DIAGNOSIS — R278 Other lack of coordination: Secondary | ICD-10-CM
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
## 2022-06-20 ENCOUNTER — Encounter: Payer: 59 | Admitting: Occupational Therapy

## 2022-06-20 ENCOUNTER — Ambulatory Visit: Payer: 59

## 2022-06-21 ENCOUNTER — Ambulatory Visit: Payer: 59 | Admitting: Occupational Therapy

## 2022-06-21 DIAGNOSIS — R278 Other lack of coordination: Secondary | ICD-10-CM

## 2022-06-21 DIAGNOSIS — M533 Sacrococcygeal disorders, not elsewhere classified: Secondary | ICD-10-CM | POA: Diagnosis not present

## 2022-06-21 DIAGNOSIS — M6281 Muscle weakness (generalized): Secondary | ICD-10-CM
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
## 2022-06-22 ENCOUNTER — Encounter: Payer: 59 | Admitting: Occupational Therapy

## 2022-06-22 ENCOUNTER — Ambulatory Visit: Payer: 59

## 2022-06-27 ENCOUNTER — Encounter: Payer: 59 | Admitting: Occupational Therapy

## 2022-06-28 ENCOUNTER — Ambulatory Visit: Payer: 59 | Attending: Neurology | Admitting: Occupational Therapy

## 2022-06-29 ENCOUNTER — Ambulatory Visit: Payer: 59

## 2022-06-29 ENCOUNTER — Encounter: Payer: 59 | Admitting: Occupational Therapy

## 2022-07-04 ENCOUNTER — Ambulatory Visit: Payer: 59

## 2022-07-05 ENCOUNTER — Ambulatory Visit: Payer: 59 | Admitting: Physical Therapy

## 2022-07-05 ENCOUNTER — Ambulatory Visit: Payer: 59

## 2022-07-06 ENCOUNTER — Ambulatory Visit: Payer: 59

## 2022-07-10 ENCOUNTER — Ambulatory Visit: Payer: 59 | Admitting: Physical Therapy

## 2022-07-11 ENCOUNTER — Ambulatory Visit: Payer: 59

## 2022-07-12 ENCOUNTER — Encounter: Payer: 59 | Admitting: Occupational Therapy

## 2022-07-13 ENCOUNTER — Ambulatory Visit: Payer: 59

## 2022-07-18 ENCOUNTER — Ambulatory Visit: Payer: 59

## 2022-07-19 ENCOUNTER — Encounter: Payer: 59 | Admitting: Physical Therapy

## 2022-07-24 ENCOUNTER — Encounter: Payer: 59 | Admitting: Physical Therapy

## 2022-07-26 ENCOUNTER — Encounter: Payer: 59 | Admitting: Physical Therapy

## 2022-08-02 ENCOUNTER — Encounter: Payer: 59 | Admitting: Physical Therapy

## 2022-08-09 ENCOUNTER — Encounter: Payer: 59 | Admitting: Physical Therapy

## 2022-08-16 ENCOUNTER — Encounter: Payer: 59 | Admitting: Physical Therapy

## 2022-08-23 ENCOUNTER — Encounter: Payer: 59 | Admitting: Physical Therapy

## 2022-08-30 ENCOUNTER — Encounter: Payer: 59 | Admitting: Physical Therapy

## 2022-09-06 ENCOUNTER — Encounter: Payer: 59 | Admitting: Physical Therapy
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
## 2024-09-17 ENCOUNTER — Ambulatory Visit
